# Patient Record
Sex: Female | Born: 1980 | Race: Asian | Hispanic: No | Marital: Married | Smoking: Never smoker
Health system: Southern US, Community
[De-identification: ages and names within clinical notes are randomized; demographics above are authoritative.]

## PROBLEM LIST (undated history)

## (undated) ENCOUNTER — Inpatient Hospital Stay (HOSPITAL_COMMUNITY): Payer: Self-pay

## (undated) DIAGNOSIS — E039 Hypothyroidism, unspecified: Secondary | ICD-10-CM

## (undated) HISTORY — PX: NO PAST SURGERIES: SHX2092

---

## 2013-04-21 LAB — OB RESULTS CONSOLE ABO/RH: RH TYPE: POSITIVE

## 2013-04-21 LAB — OB RESULTS CONSOLE RPR: RPR: NONREACTIVE

## 2013-04-21 LAB — OB RESULTS CONSOLE HEPATITIS B SURFACE ANTIGEN: Hepatitis B Surface Ag: NEGATIVE

## 2013-04-21 LAB — OB RESULTS CONSOLE ANTIBODY SCREEN: Antibody Screen: NEGATIVE

## 2013-04-21 LAB — OB RESULTS CONSOLE GC/CHLAMYDIA
CHLAMYDIA, DNA PROBE: NEGATIVE
Gonorrhea: NEGATIVE

## 2013-04-21 LAB — OB RESULTS CONSOLE RUBELLA ANTIBODY, IGM: Rubella: IMMUNE

## 2013-04-21 LAB — OB RESULTS CONSOLE HIV ANTIBODY (ROUTINE TESTING): HIV: NONREACTIVE

## 2013-07-26 ENCOUNTER — Inpatient Hospital Stay (HOSPITAL_COMMUNITY): Admission: AD | Admit: 2013-07-26 | Payer: Self-pay | Source: Ambulatory Visit | Admitting: Obstetrics and Gynecology

## 2013-08-12 ENCOUNTER — Other Ambulatory Visit: Payer: Self-pay

## 2013-08-13 ENCOUNTER — Other Ambulatory Visit (HOSPITAL_COMMUNITY): Payer: Self-pay | Admitting: Obstetrics and Gynecology

## 2013-08-13 DIAGNOSIS — O4100X0 Oligohydramnios, unspecified trimester, not applicable or unspecified: Secondary | ICD-10-CM

## 2013-08-20 ENCOUNTER — Ambulatory Visit (HOSPITAL_COMMUNITY)
Admission: RE | Admit: 2013-08-20 | Discharge: 2013-08-20 | Disposition: A | Discharge status: Home / Self Care | Payer: BC Managed Care – PPO | Source: Ambulatory Visit | Attending: Obstetrics and Gynecology | Admitting: Obstetrics and Gynecology

## 2013-08-20 ENCOUNTER — Encounter (HOSPITAL_COMMUNITY): Payer: Self-pay

## 2013-08-20 VITALS — BP 111/63 | HR 90 | Wt 166.0 lb

## 2013-08-20 DIAGNOSIS — O4100X0 Oligohydramnios, unspecified trimester, not applicable or unspecified: Secondary | ICD-10-CM | POA: Insufficient documentation

## 2013-08-20 DIAGNOSIS — O43899 Other placental disorders, unspecified trimester: Secondary | ICD-10-CM | POA: Insufficient documentation

## 2013-08-20 DIAGNOSIS — Z363 Encounter for antenatal screening for malformations: Secondary | ICD-10-CM | POA: Insufficient documentation

## 2013-08-20 DIAGNOSIS — O99280 Endocrine, nutritional and metabolic diseases complicating pregnancy, unspecified trimester: Secondary | ICD-10-CM

## 2013-08-20 DIAGNOSIS — E079 Disorder of thyroid, unspecified: Secondary | ICD-10-CM | POA: Insufficient documentation

## 2013-08-20 DIAGNOSIS — E039 Hypothyroidism, unspecified: Secondary | ICD-10-CM | POA: Insufficient documentation

## 2013-08-20 DIAGNOSIS — O358XX0 Maternal care for other (suspected) fetal abnormality and damage, not applicable or unspecified: Secondary | ICD-10-CM | POA: Insufficient documentation

## 2013-08-20 DIAGNOSIS — Z1389 Encounter for screening for other disorder: Secondary | ICD-10-CM | POA: Insufficient documentation

## 2013-08-20 HISTORY — DX: Hypothyroidism, unspecified: E03.9

## 2013-09-02 ENCOUNTER — Institutional Professional Consult (permissible substitution): Payer: Self-pay | Admitting: Pediatrics

## 2013-09-05 ENCOUNTER — Inpatient Hospital Stay (HOSPITAL_COMMUNITY)
Admission: AD | Admit: 2013-09-05 | Discharge: 2013-09-05 | Disposition: A | Discharge status: Home / Self Care | Payer: BC Managed Care – PPO | Source: Ambulatory Visit | Attending: Obstetrics and Gynecology | Admitting: Obstetrics and Gynecology

## 2013-09-05 ENCOUNTER — Encounter (HOSPITAL_COMMUNITY): Payer: Self-pay | Admitting: *Deleted

## 2013-09-05 DIAGNOSIS — IMO0002 Reserved for concepts with insufficient information to code with codable children: Secondary | ICD-10-CM | POA: Insufficient documentation

## 2013-09-05 DIAGNOSIS — Z3490 Encounter for supervision of normal pregnancy, unspecified, unspecified trimester: Secondary | ICD-10-CM

## 2013-09-05 DIAGNOSIS — R609 Edema, unspecified: Secondary | ICD-10-CM

## 2013-09-05 LAB — URINALYSIS, ROUTINE W REFLEX MICROSCOPIC
BILIRUBIN URINE: NEGATIVE
GLUCOSE, UA: 100 mg/dL — AB
HGB URINE DIPSTICK: NEGATIVE
KETONES UR: NEGATIVE mg/dL
Leukocytes, UA: NEGATIVE
NITRITE: NEGATIVE
PH: 5.5 (ref 5.0–8.0)
Protein, ur: NEGATIVE mg/dL
Urobilinogen, UA: 0.2 mg/dL (ref 0.0–1.0)

## 2013-09-05 NOTE — Discharge Instructions (Signed)

## 2013-09-05 NOTE — MAU Provider Note (Signed)
History     CSN: [id number]  Arrival date and time: 09/05/13 7829   First Provider Initiated Contact with Patient 09/05/13 2049      Chief Complaint  Patient presents with  . Foot Swelling   HPI Comments: Amy Wood 32 y.o. G1P0000 presents to MAU with swelling in feet after a trip to [place] today. Her BP has not been elevated and now is 105/64. She denies any headache, blurred vision, abdominal pains.          Past Medical History  Diagnosis Date  . Hypothyroidism     Past Surgical History  Procedure Laterality Date  . No past surgeries      History reviewed. No pertinent family history.  History  Substance Use Topics  . Smoking status: Never Smoker   . Smokeless tobacco: Never Used  . Alcohol Use: No    Allergies: No Known Allergies  Prescriptions prior to admission  Medication Sig Dispense Refill  . Cholecalciferol (VITAMIN D) 2000 UNITS CAPS Take 1 capsule by mouth daily.      Marland Kitchen levothyroxine (SYNTHROID, LEVOTHROID) 150 MCG tablet Take 150 mcg by mouth daily before breakfast.      . Prenatal Vit-Fe Fumarate-FA (PRENATAL MULTIVITAMIN) TABS tablet Take 1 tablet by mouth daily at 12 noon.        Review of Systems  Constitutional: Negative.   Eyes: Negative.   Cardiovascular: Positive for leg swelling.  Gastrointestinal: Negative.   Genitourinary: Negative.   Musculoskeletal: Negative.   Skin: Negative.   Neurological: Negative.   Psychiatric/Behavioral: Negative.    Physical Exam   Blood pressure 105/64, pulse 93, temperature 97.9 F (36.6 C), temperature source Oral, resp. rate 20, height 5\' 3"  (1.6 m), weight 171 lb (77.565 kg), last menstrual period 02/21/2013.  Physical Exam  Constitutional: She is oriented to person, place, and time. She appears well-developed and well-nourished. No distress.  HENT:  Head: Normocephalic and atraumatic.  Eyes: Pupils are equal, round, and reactive to light.  Cardiovascular: Normal rate,  regular rhythm and normal heart sounds.   Respiratory: Effort normal and breath sounds normal.  GI: Soft. Bowel sounds are normal.  Genitourinary:  Not examined  Musculoskeletal: She exhibits edema.  +1 edema bilateral  Neurological: She is alert and oriented to person, place, and time.  Skin: Skin is warm and dry.  Psychiatric: She has a normal mood and affect. Her behavior is normal. Judgment and thought content normal.   Results for orders placed during the hospital encounter of 09/05/13 (from the past 24 hour(s))  URINALYSIS, ROUTINE W REFLEX MICROSCOPIC     Status: Abnormal   Collection Time    09/05/13  7:40 PM      Result Value Ref Range   Color, Urine YELLOW  YELLOW   APPearance CLEAR  CLEAR   Specific Gravity, Urine <1.005 (*) 1.005 - 1.030   pH 5.5  5.0 - 8.0   Glucose, UA 100 (*) NEGATIVE mg/dL   Hgb urine dipstick NEGATIVE  NEGATIVE   Bilirubin Urine NEGATIVE  NEGATIVE   Ketones, ur NEGATIVE  NEGATIVE mg/dL   Protein, ur NEGATIVE  NEGATIVE mg/dL   Urobilinogen, UA 0.2  0.0 - 1.0 mg/dL   Nitrite NEGATIVE  NEGATIVE   Leukocytes, UA NEGATIVE  NEGATIVE     MAU Course  Procedures  MDM  Spoke with Dr Marcelle Overlie who agreed it was ok to send her home  Assessment and Plan   A: Peripheral Edema  P: Advised on all  ways to help with edema in pregnancy Follow up with Dr Adkins/ Marcelle OverlieHolland  Amy ServeBarefoot, Amy Wood 09/05/2013, 8:49 PM

## 2013-09-05 NOTE — MAU Note (Signed)
PT SAYS  SHE  TRAVELLED TO [place] TODAY- AND FEET NOW FEEL PUFFY.  HAS BEEN LITTLE  SWOLLEN  BEFORE-  LEFT IS MORE SWOLLEN THAN  RIGHT...    DENIES UC.

## 2013-09-08 ENCOUNTER — Ambulatory Visit (INDEPENDENT_AMBULATORY_CARE_PROVIDER_SITE_OTHER): Payer: Self-pay | Admitting: Pediatrics

## 2013-09-08 DIAGNOSIS — Z7681 Expectant parent(s) prebirth pediatrician visit: Secondary | ICD-10-CM

## 2013-09-08 NOTE — Progress Notes (Signed)
Prenatal counseling for impending newborn done  

## 2013-09-16 ENCOUNTER — Ambulatory Visit (HOSPITAL_COMMUNITY)
Admission: RE | Admit: 2013-09-16 | Discharge: 2013-09-16 | Disposition: A | Discharge status: Home / Self Care | Payer: BC Managed Care – PPO | Source: Ambulatory Visit | Attending: Obstetrics and Gynecology | Admitting: Obstetrics and Gynecology

## 2013-09-16 ENCOUNTER — Other Ambulatory Visit (HOSPITAL_COMMUNITY): Payer: Self-pay | Admitting: Maternal and Fetal Medicine

## 2013-09-16 VITALS — BP 118/64 | HR 88 | Wt 173.0 lb

## 2013-09-16 DIAGNOSIS — O4100X0 Oligohydramnios, unspecified trimester, not applicable or unspecified: Secondary | ICD-10-CM

## 2013-09-16 DIAGNOSIS — E039 Hypothyroidism, unspecified: Secondary | ICD-10-CM | POA: Insufficient documentation

## 2013-09-16 DIAGNOSIS — O43899 Other placental disorders, unspecified trimester: Secondary | ICD-10-CM | POA: Insufficient documentation

## 2013-09-16 DIAGNOSIS — E079 Disorder of thyroid, unspecified: Secondary | ICD-10-CM

## 2013-09-16 DIAGNOSIS — O99280 Endocrine, nutritional and metabolic diseases complicating pregnancy, unspecified trimester: Secondary | ICD-10-CM

## 2013-09-30 ENCOUNTER — Other Ambulatory Visit (HOSPITAL_COMMUNITY): Payer: Self-pay | Admitting: Maternal and Fetal Medicine

## 2013-09-30 ENCOUNTER — Observation Stay (HOSPITAL_COMMUNITY)
Admission: AD | Admit: 2013-09-30 | Discharge: 2013-10-01 | Disposition: A | Discharge status: Home / Self Care | Payer: BC Managed Care – PPO | Source: Ambulatory Visit | Attending: Obstetrics and Gynecology | Admitting: Obstetrics and Gynecology

## 2013-09-30 ENCOUNTER — Ambulatory Visit (HOSPITAL_COMMUNITY)
Admission: RE | Admit: 2013-09-30 | Discharge: 2013-09-30 | Disposition: A | Discharge status: Home / Self Care | Payer: BC Managed Care – PPO | Source: Ambulatory Visit | Attending: Obstetrics and Gynecology | Admitting: Obstetrics and Gynecology

## 2013-09-30 ENCOUNTER — Encounter (HOSPITAL_COMMUNITY): Payer: Self-pay | Admitting: *Deleted

## 2013-09-30 DIAGNOSIS — O4100X0 Oligohydramnios, unspecified trimester, not applicable or unspecified: Principal | ICD-10-CM | POA: Insufficient documentation

## 2013-09-30 DIAGNOSIS — E079 Disorder of thyroid, unspecified: Secondary | ICD-10-CM

## 2013-09-30 DIAGNOSIS — O43899 Other placental disorders, unspecified trimester: Secondary | ICD-10-CM

## 2013-09-30 DIAGNOSIS — E039 Hypothyroidism, unspecified: Secondary | ICD-10-CM | POA: Insufficient documentation

## 2013-09-30 DIAGNOSIS — O4103X0 Oligohydramnios, third trimester, not applicable or unspecified: Secondary | ICD-10-CM | POA: Diagnosis present

## 2013-09-30 DIAGNOSIS — O99280 Endocrine, nutritional and metabolic diseases complicating pregnancy, unspecified trimester: Secondary | ICD-10-CM

## 2013-09-30 LAB — COMPREHENSIVE METABOLIC PANEL
ALT: 13 U/L (ref 0–35)
AST: 15 U/L (ref 0–37)
Albumin: 2.5 g/dL — ABNORMAL LOW (ref 3.5–5.2)
Alkaline Phosphatase: 120 U/L — ABNORMAL HIGH (ref 39–117)
BILIRUBIN TOTAL: 0.4 mg/dL (ref 0.3–1.2)
BUN: 6 mg/dL (ref 6–23)
CHLORIDE: 101 meq/L (ref 96–112)
CO2: 25 meq/L (ref 19–32)
Calcium: 9.2 mg/dL (ref 8.4–10.5)
Creatinine, Ser: 0.47 mg/dL — ABNORMAL LOW (ref 0.50–1.10)
Glucose, Bld: 156 mg/dL — ABNORMAL HIGH (ref 70–99)
Potassium: 4.2 mEq/L (ref 3.7–5.3)
SODIUM: 139 meq/L (ref 137–147)
Total Protein: 5.7 g/dL — ABNORMAL LOW (ref 6.0–8.3)

## 2013-09-30 LAB — TSH: TSH: 1.35 u[IU]/mL (ref 0.350–4.500)

## 2013-09-30 MED ORDER — PRENATAL MULTIVITAMIN CH
1.0000 | ORAL_TABLET | Freq: Every day | ORAL | Status: DC
  Filled 2013-09-30: qty 1

## 2013-09-30 MED ORDER — ZOLPIDEM TARTRATE 5 MG PO TABS: 5.0000 mg | ORAL_TABLET | Freq: Every evening | ORAL | Status: DC | PRN

## 2013-09-30 MED ORDER — DOCUSATE SODIUM 100 MG PO CAPS
100.0000 mg | ORAL_CAPSULE | Freq: Every day | ORAL | Status: DC
  Filled 2013-09-30: qty 1

## 2013-09-30 MED ORDER — BETAMETHASONE SOD PHOS & ACET 6 (3-3) MG/ML IJ SUSP
12.0000 mg | INTRAMUSCULAR | Status: AC
  Administered 2013-09-30 – 2013-10-01 (×2): 12 mg via INTRAMUSCULAR
  Filled 2013-09-30 (×3): qty 2

## 2013-09-30 MED ORDER — LEVOTHYROXINE SODIUM 150 MCG PO TABS
150.0000 ug | ORAL_TABLET | Freq: Every day | ORAL | Status: DC
  Filled 2013-09-30 (×2): qty 1

## 2013-09-30 MED ORDER — LACTATED RINGERS IV SOLN
INTRAVENOUS | Status: DC
  Administered 2013-10-01 (×2): via INTRAVENOUS

## 2013-09-30 MED ORDER — CALCIUM CARBONATE ANTACID 500 MG PO CHEW: 2.0000 | CHEWABLE_TABLET | ORAL | Status: DC | PRN

## 2013-09-30 MED ORDER — ACETAMINOPHEN 325 MG PO TABS: 650.0000 mg | ORAL_TABLET | ORAL | Status: DC | PRN

## 2013-09-30 NOTE — Progress Notes (Signed)
[facility] ultrasound  Indication: 33 yr old G1P0 at 6321w4d with previous finding of decreased amniotic fluid volume and hypothyroidism for AFI.  Findings: 1. Single intrauterine pregnancy.  2. Anterior placenta without evidence of previa. 3. Oligohydramnios with AFI of 5.62cm; there is no 2x2cm pocket of amniotic fluid seen. 4. The limited anatomy survey is normal. 5. Normal umbilical artery Doppler studies. 6. Biophysical profile is 6/8 (-2 for fluid).  Recommendations: 1. Oligohyramnios: - Discussed etiology is unclear. Patient reports she has not had any leaking of fluid or vaginal bleeding. Recommend patient be evaluated on labor and delivery to rule out rupture of membranes. Another possible etiology is placental insufficiency. However, it is reassuring that there was recent appropriate fetal growth and normal umbilical artery Doppler studies today.  I discussed with oligohydramnios there is an increased risk of fetal demise and nonreassuring fetal testing. Given this I recommend the patient be admitted to the hospital for extensive fetal monitoring. Recommend administration of a course of betamethasone. Recommend Neonatology consultation. Recommend on 1 liter of IV fluids and p.o. Hydration. Will repeat amniotic fluid index in 1 day. If amniotic fluid index is greater than 5cm and there is a 2x2cm pocket can consider outpatient management with close follow up for AFI and antenatal testing. Recommend delivery for nonreassuring fetal testing or no fetal growth. Fetal growth in 2 weeks 2. BPP 6/8: - needs NST; to be done on L&D  Discussed with Dr. Henderson Cloudomblin; patient sent to Labor and Delivery.  Eulis FosterKristen Machelle Raybon, MD

## 2013-09-30 NOTE — H&P (Signed)
Amy Wood is a 33 y.o. female presenting 31.4 weeks with oligohydramnios. She's been followed in the office with decreased amniotic fluid. He does have a battledore placenta. She was seen by maternal fetal medicine today. On the gram did not reveal a 2 x 2 centimeter pocket of amniotic fluid. Amniotic fluid index was less than the 3rd percentile. Doppler studies were normal. I a physical profile was 6/8. It was suggested that we admit her for hydration. We should repeat the ultrasound for amniotic fluid tomorrow. Maternal Medical History:  Prenatal complications: Oligohydramnios.   Prenatal Complications - Diabetes: none.    OB History   Grav Para Term Preterm Abortions TAB SAB Ect Mult Living   1 0 0 0 0 0 0 0 0 0      Past Medical History  Diagnosis Date  . Hypothyroidism    Past Surgical History  Procedure Laterality Date  . No past surgeries     Family History: family history is not on file. Social History:  reports that she has never smoked. She has never used smokeless tobacco. She reports that she does not drink alcohol or use illicit drugs.   Prenatal Transfer Tool  Maternal Diabetes: No Genetic Screening: Declined Maternal Ultrasounds/Referrals: Abnormal:  Findings:   Other:oligohydramnios Fetal Ultrasounds or other Referrals:  None Maternal Substance Abuse:  No Significant Maternal Medications:  None Significant Maternal Lab Results:  None Other Comments:  None  ROS    Height 5\' 4"  (1.626 m), weight 77.111 kg (170 lb), last menstrual period 02/21/2013. Exam Physical Exam  Constitutional: She is oriented to person, place, and time. She appears well-developed and well-nourished.  HENT:  Head: Normocephalic.  Cardiovascular: Normal rate, regular rhythm and normal heart sounds.   Respiratory: Effort normal and breath sounds normal.  GI:  Gravid uterus otherwise benign abdomine  Musculoskeletal: She exhibits edema.  Neurological: She is alert and  oriented to person, place, and time. She has normal reflexes.    Prenatal labs: ABO, Rh:   Antibody:   Rubella:   RPR:    HBsAg:    HIV:    GBS:     Assessment/Plan: Intrauterine pregnancy at 31.4 weeks with oligohydramnios. A she'll be admitted for hydration. Repeat ultrasound tomorrow to look for amniotic fluid.   Raphael GibneyJohn S Judy Goodenow 09/30/2013, 3:04 PM

## 2013-09-30 NOTE — Plan of Care (Signed)
Problem: Consults Goal: Birthing Suites Patient Information Press F2 to bring up selections list  Outcome: Completed/Met Date Met:  09/30/13  Pt < [redacted] weeks EGA

## 2013-10-01 ENCOUNTER — Encounter (HOSPITAL_COMMUNITY): Payer: BC Managed Care – PPO

## 2013-10-01 ENCOUNTER — Observation Stay (HOSPITAL_COMMUNITY): Payer: BC Managed Care – PPO

## 2013-10-01 LAB — CBC
HEMATOCRIT: 37.6 % (ref 36.0–46.0)
Hemoglobin: 12.9 g/dL (ref 12.0–15.0)
MCH: 31.8 pg (ref 26.0–34.0)
MCHC: 34.3 g/dL (ref 30.0–36.0)
MCV: 92.6 fL (ref 78.0–100.0)
PLATELETS: 213 10*3/uL (ref 150–400)
RBC: 4.06 MIL/uL (ref 3.87–5.11)
RDW: 12.9 % (ref 11.5–15.5)
WBC: 14.5 10*3/uL — AB (ref 4.0–10.5)

## 2013-10-01 LAB — AMNISURE RUPTURE OF MEMBRANE (ROM) NOT AT ARMC: Amnisure ROM: NEGATIVE

## 2013-10-01 NOTE — Progress Notes (Signed)
Amnisure negative.  Discharge home per previous note.  Mitchel HonourMegan Laurieanne Galloway, DO

## 2013-10-01 NOTE — Discharge Instructions (Signed)
Call MD for T>100.4, severe abdominal pain, heavy vaginal bleeding, or decreased fetal movement.  Do kick counts.  Call for appointment for follow up in office Monday for ultrasound and MD visit.

## 2013-10-01 NOTE — Discharge Summary (Signed)
Obstetric Discharge Summary Reason for Admission: oligohydramnios Prenatal Procedures: ultrasound Intrapartum Procedures: n/a Postpartum Procedures: n/a Complications-Operative and Postpartum: n/a Hemoglobin  Date Value Ref Range Status  10/01/2013 12.9  12.0 - 15.0 g/dL Final     HCT  Date Value Ref Range Status  10/01/2013 37.6  36.0 - 46.0 % Final    Physical Exam:  General: alert, cooperative and appears stated age 61Lochia: n/a Uterine Fundus: soft Incision: n/a DVT Evaluation: No evidence of DVT seen on physical exam. Negative Homan's sign. No cords or calf tenderness.  Discharge Diagnoses: borderline low amniotic fluid  Discharge Information: Date: 10/01/2013 Activity: unrestricted Diet: routine Medications: PNV Condition: stable Instructions: refer to practice specific booklet Discharge to: home   Newborn Data: <<This patient has not yet delivered during this pregnancy.>> Home with mother.  Amy Wood 10/01/2013, 1:17 PM

## 2013-10-01 NOTE — Progress Notes (Addendum)
No complaints.  No LOF.  No abdominal tenderness.  +FM.  VSS.  AF  Gen: A&O x 3 Abd: soft, NT Ext: no c/c/e  FHT Cat I Toco: CTX q 3-4 in runs.  (Pt not feeling).  MFM U/S: AFI 5 with 2x2 cm pocket seen.  BPP 8/8.  32yo G1 at 4646w5d -Will do Amnisure to r/o PPROM -If negative, will d/c home with close f/u in the office per MFM recommendations.  Mitchel HonourMegan Temprance Wyre, DO

## 2013-11-04 LAB — OB RESULTS CONSOLE GBS: STREP GROUP B AG: POSITIVE

## 2013-11-21 ENCOUNTER — Encounter (HOSPITAL_COMMUNITY): Payer: Self-pay | Admitting: *Deleted

## 2013-11-21 ENCOUNTER — Inpatient Hospital Stay (HOSPITAL_COMMUNITY)
Admission: AD | Admit: 2013-11-21 | Discharge: 2013-11-21 | Disposition: A | Discharge status: Home / Self Care | Payer: BC Managed Care – PPO | Source: Ambulatory Visit | Attending: Obstetrics and Gynecology | Admitting: Obstetrics and Gynecology

## 2013-11-21 DIAGNOSIS — O479 False labor, unspecified: Secondary | ICD-10-CM | POA: Insufficient documentation

## 2013-11-21 DIAGNOSIS — O4100X0 Oligohydramnios, unspecified trimester, not applicable or unspecified: Secondary | ICD-10-CM | POA: Insufficient documentation

## 2013-11-21 LAB — POCT FERN TEST: POCT Fern Test: NEGATIVE

## 2013-11-21 NOTE — Discharge Instructions (Signed)
Fetal Movement Counts Patient Name: __________________________________________________ Patient Due Date: ____________________ Performing a fetal movement count is highly recommended in high-risk pregnancies, but it is good for every pregnant woman to do. Your caregiver may ask you to start counting fetal movements at 28 weeks of the pregnancy. Fetal movements often increase:  After eating a full meal.  After physical activity.  After eating or drinking something sweet or cold.  At rest. Pay attention to when you feel the baby is most active. This will help you notice a pattern of your baby's sleep and wake cycles and what factors contribute to an increase in fetal movement. It is important to perform a fetal movement count at the same time each day when your baby is normally most active.  HOW TO COUNT FETAL MOVEMENTS 1. Find a quiet and comfortable area to sit or lie down on your left side. Lying on your left side provides the best blood and oxygen circulation to your baby. Write down the day and time on a sheet of paper or in a journal. Third Trimester of Pregnancy The third trimester is from week 29 through week 42, months 7 through 9. The third trimester is a time when the fetus is growing rapidly. At the end of the ninth month, the fetus is about 20 inches in length and weighs 6 10 pounds.  BODY CHANGES Your body goes through many changes during pregnancy. The changes vary from woman to woman.  Your weight will continue to increase. You can expect to gain 25 35 pounds (11 16 kg) by the end of the pregnancy. You may begin to get stretch marks on your hips, abdomen, and breasts. You may urinate more often because the fetus is moving lower into your pelvis and pressing on your bladder. You may develop or continue to have heartburn as a result of your pregnancy. You may develop constipation because certain hormones are causing the muscles that push waste through your intestines to slow  down. You may develop hemorrhoids or swollen, bulging veins (varicose veins). You may have pelvic pain because of the weight gain and pregnancy hormones relaxing your joints between the bones in your pelvis. Back aches may result from over exertion of the muscles supporting your posture. Your breasts will continue to grow and be tender. A yellow discharge may leak from your breasts called colostrum. Your belly button may stick out. You may feel short of breath because of your expanding uterus. You may notice the fetus "dropping," or moving lower in your abdomen. You may have a bloody mucus discharge. This usually occurs a few days to a week before labor begins. Your cervix becomes thin and soft (effaced) near your due date. WHAT TO EXPECT AT YOUR PRENATAL EXAMS  You will have prenatal exams every 2 weeks until week 36. Then, you will have weekly prenatal exams. During a routine prenatal visit: You will be weighed to make sure you and the fetus are growing normally. Your blood pressure is taken. Your abdomen will be measured to track your baby's growth. The fetal heartbeat will be listened to. Any test results from the previous visit will be discussed. You may have a cervical check near your due date to see if you have effaced. At around 36 weeks, your caregiver will check your cervix. At the same time, your caregiver will also perform a test on the secretions of the vaginal tissue. This test is to determine if a type of bacteria, Group B streptococcus, is present.  Your caregiver will explain this further. Your caregiver may ask you: What your birth plan is. How you are feeling. If you are feeling the baby move. If you have had any abnormal symptoms, such as leaking fluid, bleeding, severe headaches, or abdominal cramping. If you have any questions. Other tests or screenings that may be performed during your third trimester include: Blood tests that check for low iron levels (anemia). Fetal  testing to check the health, activity level, and growth of the fetus. Testing is done if you have certain medical conditions or if there are problems during the pregnancy. FALSE LABOR You may feel small, irregular contractions that eventually go away. These are called Braxton Hicks contractions, or false labor. Contractions may last for hours, days, or even weeks before true labor sets in. If contractions come at regular intervals, intensify, or become painful, it is best to be seen by your caregiver.  SIGNS OF LABOR  Menstrual-like cramps. Contractions that are 5 minutes apart or less. Contractions that start on the top of the uterus and spread down to the lower abdomen and back. A sense of increased pelvic pressure or back pain. A watery or bloody mucus discharge that comes from the vagina. If you have any of these signs before the 37th week of pregnancy, call your caregiver right away. You need to go to the hospital to get checked immediately. HOME CARE INSTRUCTIONS  Avoid all smoking, herbs, alcohol, and unprescribed drugs. These chemicals affect the formation and growth of the baby. Follow your caregiver's instructions regarding medicine use. There are medicines that are either safe or unsafe to take during pregnancy. Exercise only as directed by your caregiver. Experiencing uterine cramps is a good sign to stop exercising. Continue to eat regular, healthy meals. Wear a good support bra for breast tenderness. Do not use hot tubs, steam rooms, or saunas. Wear your seat belt at all times when driving. Avoid raw meat, uncooked cheese, cat litter boxes, and soil used by cats. These carry germs that can cause birth defects in the baby. Take your prenatal vitamins. Try taking a stool softener (if your caregiver approves) if you develop constipation. Eat more high-fiber foods, such as fresh vegetables or fruit and whole grains. Drink plenty of fluids to keep your urine clear or pale yellow. Take  warm sitz baths to soothe any pain or discomfort caused by hemorrhoids. Use hemorrhoid cream if your caregiver approves. If you develop varicose veins, wear support hose. Elevate your feet for 15 minutes, 3 4 times a day. Limit salt in your diet. Avoid heavy lifting, wear low heal shoes, and practice good posture. Rest a lot with your legs elevated if you have leg cramps or low back pain. Visit your dentist if you have not gone during your pregnancy. Use a soft toothbrush to brush your teeth and be gentle when you floss. A sexual relationship may be continued unless your caregiver directs you otherwise. Do not travel far distances unless it is absolutely necessary and only with the approval of your caregiver. Take prenatal classes to understand, practice, and ask questions about the labor and delivery. Make a trial run to the hospital. Pack your hospital bag. Prepare the baby's nursery. Continue to go to all your prenatal visits as directed by your caregiver. SEEK MEDICAL CARE IF: You are unsure if you are in labor or if your water has broken. You have dizziness. You have mild pelvic cramps, pelvic pressure, or nagging pain in your abdominal area. You  have persistent nausea, vomiting, or diarrhea. You have a bad smelling vaginal discharge. You have pain with urination. SEEK IMMEDIATE MEDICAL CARE IF:  You have a fever. You are leaking fluid from your vagina. You have spotting or bleeding from your vagina. You have severe abdominal cramping or pain. You have rapid weight loss or gain. You have shortness of breath with chest pain. You notice sudden or extreme swelling of your face, hands, ankles, feet, or legs. You have not felt your baby move in over an hour. You have severe headaches that do not go away with medicine. You have vision changes. Document Released: 05/22/2001 Document Revised: 01/28/2013 Document Reviewed: 07/29/2012 ExitCare Patient Information 2014 [place],  [place].  2. Start counting kicks, flutters, swishes, rolls, or jabs in a 2 hour period. You should feel at least 10 movements within 2 hours. 3. If you do not feel 10 movements in 2 hours, wait 2 3 hours and count again. Look for a change in the pattern or not enough counts in 2 hours. SEEK MEDICAL CARE IF:  You feel less than 10 counts in 2 hours, tried twice.  There is no movement in over an hour.  The pattern is changing or taking longer each day to reach 10 counts in 2 hours.  You feel the baby is not moving as he or she usually does. Date: ____________ Movements: ____________ Start time: ____________ Doreatha Martin time: ____________  Date: ____________ Movements: ____________ Start time: ____________ Doreatha Martin time: ____________ Date: ____________ Movements: ____________ Start time: ____________ Doreatha Martin time: ____________ Date: ____________ Movements: ____________ Start time: ____________ Doreatha Martin time: ____________ Date: ____________ Movements: ____________ Start time: ____________ Doreatha Martin time: ____________ Date: ____________ Movements: ____________ Start time: ____________ Doreatha Martin time: ____________ Date: ____________ Movements: ____________ Start time: ____________ Doreatha Martin time: ____________ Date: ____________ Movements: ____________ Start time: ____________ Doreatha Martin time: ____________  Date: ____________ Movements: ____________ Start time: ____________ Doreatha Martin time: ____________ Date: ____________ Movements: ____________ Start time: ____________ Doreatha Martin time: ____________ Date: ____________ Movements: ____________ Start time: ____________ Doreatha Martin time: ____________ Date: ____________ Movements: ____________ Start time: ____________ Doreatha Martin time: ____________ Date: ____________ Movements: ____________ Start time: ____________ Doreatha Martin time: ____________ Date: ____________ Movements: ____________ Start time: ____________ Doreatha Martin time: ____________ Date: ____________ Movements: ____________ Start time:  ____________ Doreatha Martin time: ____________  Date: ____________ Movements: ____________ Start time: ____________ Doreatha Martin time: ____________ Date: ____________ Movements: ____________ Start time: ____________ Doreatha Martin time: ____________ Date: ____________ Movements: ____________ Start time: ____________ Doreatha Martin time: ____________ Date: ____________ Movements: ____________ Start time: ____________ Doreatha Martin time: ____________ Date: ____________ Movements: ____________ Start time: ____________ Doreatha Martin time: ____________ Date: ____________ Movements: ____________ Start time: ____________ Doreatha Martin time: ____________ Date: ____________ Movements: ____________ Start time: ____________ Doreatha Martin time: ____________  Date: ____________ Movements: ____________ Start time: ____________ Doreatha Martin time: ____________ Date: ____________ Movements: ____________ Start time: ____________ Doreatha Martin time: ____________ Date: ____________ Movements: ____________ Start time: ____________ Doreatha Martin time: ____________ Date: ____________ Movements: ____________ Start time: ____________ Doreatha Martin time: ____________ Date: ____________ Movements: ____________ Start time: ____________ Doreatha Martin time: ____________ Date: ____________ Movements: ____________ Start time: ____________ Doreatha Martin time: ____________ Date: ____________ Movements: ____________ Start time: ____________ Doreatha Martin time: ____________  Date: ____________ Movements: ____________ Start time: ____________ Doreatha Martin time: ____________ Date: ____________ Movements: ____________ Start time: ____________ Doreatha Martin time: ____________ Date: ____________ Movements: ____________ Start time: ____________ Doreatha Martin time: ____________ Date: ____________ Movements: ____________ Start time: ____________ Doreatha Martin time: ____________ Date: ____________ Movements: ____________ Start time: ____________ Doreatha Martin time: ____________ Date: ____________ Movements: ____________ Start time: ____________ Doreatha Martin time: ____________ Date:  ____________ Movements: ____________ Start time: ____________ Doreatha Martin time:  ____________  Date: ____________ Movements: ____________ Start time: ____________ Doreatha MartinFinish time: ____________ Date: ____________ Movements: ____________ Start time: ____________ Doreatha MartinFinish time: ____________ Date: ____________ Movements: ____________ Start time: ____________ Doreatha MartinFinish time: ____________ Date: ____________ Movements: ____________ Start time: ____________ Doreatha MartinFinish time: ____________ Date: ____________ Movements: ____________ Start time: ____________ Doreatha MartinFinish time: ____________ Date: ____________ Movements: ____________ Start time: ____________ Doreatha MartinFinish time: ____________ Date: ____________ Movements: ____________ Start time: ____________ Doreatha MartinFinish time: ____________  Date: ____________ Movements: ____________ Start time: ____________ Doreatha MartinFinish time: ____________ Date: ____________ Movements: ____________ Start time: ____________ Doreatha MartinFinish time: ____________ Date: ____________ Movements: ____________ Start time: ____________ Doreatha MartinFinish time: ____________ Date: ____________ Movements: ____________ Start time: ____________ Doreatha MartinFinish time: ____________ Date: ____________ Movements: ____________ Start time: ____________ Doreatha MartinFinish time: ____________ Date: ____________ Movements: ____________ Start time: ____________ Doreatha MartinFinish time: ____________ Date: ____________ Movements: ____________ Start time: ____________ Doreatha MartinFinish time: ____________  Date: ____________ Movements: ____________ Start time: ____________ Doreatha MartinFinish time: ____________ Date: ____________ Movements: ____________ Start time: ____________ Doreatha MartinFinish time: ____________ Date: ____________ Movements: ____________ Start time: ____________ Doreatha MartinFinish time: ____________ Date: ____________ Movements: ____________ Start time: ____________ Doreatha MartinFinish time: ____________ Date: ____________ Movements: ____________ Start time: ____________ Doreatha MartinFinish time: ____________ Date: ____________ Movements: ____________ Start  time: ____________ Doreatha MartinFinish time: ____________ Document Released: 06/27/2006 Document Revised: 05/14/2012 Document Reviewed: 03/24/2012 Exit[employer] Patient Information 2014 [employer].

## 2013-11-21 NOTE — MAU Note (Signed)
Gush of fluid, some trickling on legs @ 1200, denies bleeding or uc's.

## 2013-11-21 NOTE — MAU Provider Note (Signed)
S: Amy Wood is a 33 y.o. G1P0000 at 4751w0d who presents today with LOF. She states that around 1200 today she had some small gushes of fluid. She denies any pain, contractions or bleeding, and states that that the baby is moving normally. She states that she has had oligohydramnios with the pregnancy, and had an US yesterday with an AFI of 10. She denies any other complications with the pregnancy O: VSS, afebrile External: no lesion Vagina: small amount of white discharge. No pooling  Cervix: pink, smooth, closed/thick/high. Difficult exam Uterus: AGA FHT 135, moderate with 15x15 accels, no decels Toco: irregular UCs, patient states that she does not feel them. A/P: possible ROM Fern pending RN will report to MD

## 2013-11-25 ENCOUNTER — Encounter (HOSPITAL_COMMUNITY)
Admission: AD | Disposition: A | Discharge status: Home / Self Care | Payer: Self-pay | Source: Ambulatory Visit | Attending: Obstetrics and Gynecology

## 2013-11-25 ENCOUNTER — Encounter (HOSPITAL_COMMUNITY): Payer: Self-pay | Admitting: *Deleted

## 2013-11-25 ENCOUNTER — Inpatient Hospital Stay (HOSPITAL_COMMUNITY): Payer: BC Managed Care – PPO | Admitting: Anesthesiology

## 2013-11-25 ENCOUNTER — Encounter (HOSPITAL_COMMUNITY): Payer: BC Managed Care – PPO | Admitting: Anesthesiology

## 2013-11-25 ENCOUNTER — Inpatient Hospital Stay (HOSPITAL_COMMUNITY)
Admission: AD | Admit: 2013-11-25 | Discharge: 2013-11-28 | DRG: 765 | Disposition: A | Discharge status: Home / Self Care | Payer: BC Managed Care – PPO | Source: Ambulatory Visit | Attending: Obstetrics and Gynecology | Admitting: Obstetrics and Gynecology

## 2013-11-25 DIAGNOSIS — O43899 Other placental disorders, unspecified trimester: Secondary | ICD-10-CM | POA: Diagnosis present

## 2013-11-25 DIAGNOSIS — O4100X0 Oligohydramnios, unspecified trimester, not applicable or unspecified: Secondary | ICD-10-CM | POA: Diagnosis present

## 2013-11-25 DIAGNOSIS — O99892 Other specified diseases and conditions complicating childbirth: Secondary | ICD-10-CM | POA: Diagnosis present

## 2013-11-25 DIAGNOSIS — O9989 Other specified diseases and conditions complicating pregnancy, childbirth and the puerperium: Secondary | ICD-10-CM

## 2013-11-25 DIAGNOSIS — Z22330 Carrier of Group B streptococcus: Secondary | ICD-10-CM

## 2013-11-25 DIAGNOSIS — IMO0001 Reserved for inherently not codable concepts without codable children: Secondary | ICD-10-CM

## 2013-11-25 DIAGNOSIS — O99284 Endocrine, nutritional and metabolic diseases complicating childbirth: Secondary | ICD-10-CM

## 2013-11-25 DIAGNOSIS — Z98891 History of uterine scar from previous surgery: Secondary | ICD-10-CM

## 2013-11-25 DIAGNOSIS — E079 Disorder of thyroid, unspecified: Secondary | ICD-10-CM | POA: Diagnosis present

## 2013-11-25 DIAGNOSIS — E039 Hypothyroidism, unspecified: Secondary | ICD-10-CM | POA: Diagnosis present

## 2013-11-25 LAB — TYPE AND SCREEN
ABO/RH(D): O POS
Antibody Screen: NEGATIVE

## 2013-11-25 LAB — CBC
HCT: 40.9 % (ref 36.0–46.0)
Hemoglobin: 14.2 g/dL (ref 12.0–15.0)
MCH: 31.7 pg (ref 26.0–34.0)
MCHC: 34.7 g/dL (ref 30.0–36.0)
MCV: 91.3 fL (ref 78.0–100.0)
PLATELETS: 197 10*3/uL (ref 150–400)
RBC: 4.48 MIL/uL (ref 3.87–5.11)
RDW: 12.7 % (ref 11.5–15.5)
WBC: 11 10*3/uL — ABNORMAL HIGH (ref 4.0–10.5)

## 2013-11-25 LAB — RPR

## 2013-11-25 LAB — ABO/RH: ABO/RH(D): O POS

## 2013-11-25 SURGERY — Surgical Case
Anesthesia: Epidural | Site: Abdomen

## 2013-11-25 MED ORDER — ONDANSETRON HCL 4 MG/2ML IJ SOLN: 4.0000 mg | Freq: Four times a day (QID) | INTRAMUSCULAR | Status: DC | PRN

## 2013-11-25 MED ORDER — LANOLIN HYDROUS EX OINT: 1.0000 "application " | TOPICAL_OINTMENT | CUTANEOUS | Status: DC | PRN

## 2013-11-25 MED ORDER — BISACODYL 10 MG RE SUPP: 10.0000 mg | Freq: Every day | RECTAL | Status: DC | PRN

## 2013-11-25 MED ORDER — TERBUTALINE SULFATE 1 MG/ML IJ SOLN: 0.2500 mg | Freq: Once | INTRAMUSCULAR | Status: DC | PRN

## 2013-11-25 MED ORDER — OXYTOCIN 40 UNITS IN LACTATED RINGERS INFUSION - SIMPLE MED
62.5000 mL/h | INTRAVENOUS | Status: DC
Start: 2013-11-25 — End: 2013-11-25

## 2013-11-25 MED ORDER — NALOXONE HCL 1 MG/ML IJ SOLN
1.0000 ug/kg/h | INTRAVENOUS | Status: DC | PRN
  Filled 2013-11-25: qty 2

## 2013-11-25 MED ORDER — LACTATED RINGERS IV SOLN
INTRAVENOUS | Status: DC
  Administered 2013-11-25: 125 mL/h via INTRAVENOUS
  Administered 2013-11-25 (×3): via INTRAVENOUS

## 2013-11-25 MED ORDER — WITCH HAZEL-GLYCERIN EX PADS
1.0000 | MEDICATED_PAD | CUTANEOUS | Status: DC | PRN
Start: 2013-11-25 — End: 2013-11-28

## 2013-11-25 MED ORDER — OXYCODONE-ACETAMINOPHEN 5-325 MG PO TABS
1.0000 | ORAL_TABLET | ORAL | Status: DC | PRN
  Administered 2013-11-27 – 2013-11-28 (×3): 1 via ORAL
  Filled 2013-11-25 (×3): qty 1

## 2013-11-25 MED ORDER — SODIUM CHLORIDE 0.9 % IV SOLN: 250.0000 mL | INTRAVENOUS | Status: DC

## 2013-11-25 MED ORDER — NALBUPHINE HCL 10 MG/ML IJ SOLN: 5.0000 mg | INTRAMUSCULAR | Status: DC | PRN

## 2013-11-25 MED ORDER — PHENYLEPHRINE HCL 10 MG/ML IJ SOLN
INTRAMUSCULAR | Status: DC | PRN
  Administered 2013-11-25: 80 ug via INTRAVENOUS

## 2013-11-25 MED ORDER — DIPHENHYDRAMINE HCL 50 MG/ML IJ SOLN: 25.0000 mg | INTRAMUSCULAR | Status: DC | PRN

## 2013-11-25 MED ORDER — FLEET ENEMA 7-19 GM/118ML RE ENEM: 1.0000 | ENEMA | RECTAL | Status: DC | PRN

## 2013-11-25 MED ORDER — PHENYLEPHRINE 8 MG IN D5W 100 ML (0.08MG/ML) PREMIX OPTIME: INJECTION | INTRAVENOUS | Status: DC | PRN

## 2013-11-25 MED ORDER — MORPHINE SULFATE (PF) 0.5 MG/ML IJ SOLN
INTRAMUSCULAR | Status: DC | PRN
  Administered 2013-11-25: 4 mg via EPIDURAL

## 2013-11-25 MED ORDER — SIMETHICONE 80 MG PO CHEW
80.0000 mg | CHEWABLE_TABLET | ORAL | Status: DC
  Administered 2013-11-26 – 2013-11-27 (×3): 80 mg via ORAL
  Filled 2013-11-25 (×3): qty 1

## 2013-11-25 MED ORDER — DIPHENHYDRAMINE HCL 25 MG PO CAPS
25.0000 mg | ORAL_CAPSULE | Freq: Four times a day (QID) | ORAL | Status: DC | PRN
Start: 2013-11-25 — End: 2013-11-28

## 2013-11-25 MED ORDER — KETOROLAC TROMETHAMINE 60 MG/2ML IM SOLN
60.0000 mg | Freq: Once | INTRAMUSCULAR | Status: AC | PRN
  Administered 2013-11-25: 60 mg via INTRAMUSCULAR

## 2013-11-25 MED ORDER — ZOLPIDEM TARTRATE 5 MG PO TABS: 5.0000 mg | ORAL_TABLET | Freq: Every evening | ORAL | Status: DC | PRN

## 2013-11-25 MED ORDER — OXYTOCIN 40 UNITS IN LACTATED RINGERS INFUSION - SIMPLE MED: 62.5000 mL/h | INTRAVENOUS | Status: AC

## 2013-11-25 MED ORDER — MENTHOL 3 MG MT LOZG
1.0000 | LOZENGE | OROMUCOSAL | Status: DC | PRN
Start: 2013-11-25 — End: 2013-11-28

## 2013-11-25 MED ORDER — SODIUM CHLORIDE 0.9 % IJ SOLN: 3.0000 mL | INTRAMUSCULAR | Status: DC | PRN

## 2013-11-25 MED ORDER — PHENYLEPHRINE 40 MCG/ML (10ML) SYRINGE FOR IV PUSH (FOR BLOOD PRESSURE SUPPORT)
PREFILLED_SYRINGE | INTRAVENOUS | Status: AC
  Filled 2013-11-25: qty 5

## 2013-11-25 MED ORDER — ONDANSETRON HCL 4 MG/2ML IJ SOLN
INTRAMUSCULAR | Status: AC
  Filled 2013-11-25: qty 2

## 2013-11-25 MED ORDER — DIPHENHYDRAMINE HCL 50 MG/ML IJ SOLN: 12.5000 mg | INTRAMUSCULAR | Status: DC | PRN

## 2013-11-25 MED ORDER — CEFAZOLIN SODIUM-DEXTROSE 2-3 GM-% IV SOLR
INTRAVENOUS | Status: DC | PRN
  Administered 2013-11-25: 2 g via INTRAVENOUS

## 2013-11-25 MED ORDER — ONDANSETRON HCL 4 MG PO TABS: 4.0000 mg | ORAL_TABLET | ORAL | Status: DC | PRN

## 2013-11-25 MED ORDER — FENTANYL 2.5 MCG/ML BUPIVACAINE 1/10 % EPIDURAL INFUSION (WH - ANES)
14.0000 mL/h | INTRAMUSCULAR | Status: DC | PRN
  Administered 2013-11-25: 14 mL/h via EPIDURAL

## 2013-11-25 MED ORDER — KETOROLAC TROMETHAMINE 30 MG/ML IJ SOLN: 30.0000 mg | Freq: Four times a day (QID) | INTRAMUSCULAR | Status: DC | PRN

## 2013-11-25 MED ORDER — SODIUM BICARBONATE 8.4 % IV SOLN
INTRAVENOUS | Status: DC | PRN
  Administered 2013-11-25 (×2): 5 mL via EPIDURAL

## 2013-11-25 MED ORDER — MORPHINE SULFATE (PF) 0.5 MG/ML IJ SOLN
INTRAMUSCULAR | Status: DC | PRN
  Administered 2013-11-25: 1 mg via EPIDURAL

## 2013-11-25 MED ORDER — MEPERIDINE HCL 25 MG/ML IJ SOLN
INTRAMUSCULAR | Status: DC | PRN
  Administered 2013-11-25 (×2): 12.5 mg via INTRAVENOUS

## 2013-11-25 MED ORDER — ONDANSETRON HCL 4 MG/2ML IJ SOLN: 4.0000 mg | Freq: Three times a day (TID) | INTRAMUSCULAR | Status: DC | PRN

## 2013-11-25 MED ORDER — LIDOCAINE HCL (PF) 1 % IJ SOLN
INTRAMUSCULAR | Status: DC | PRN
  Administered 2013-11-25: 10 mL

## 2013-11-25 MED ORDER — SCOPOLAMINE 1 MG/3DAYS TD PT72
1.0000 | MEDICATED_PATCH | Freq: Once | TRANSDERMAL | Status: DC
  Administered 2013-11-25: 1.5 mg via TRANSDERMAL

## 2013-11-25 MED ORDER — ONDANSETRON HCL 4 MG/2ML IJ SOLN
INTRAMUSCULAR | Status: DC | PRN
  Administered 2013-11-25: 4 mg via INTRAVENOUS

## 2013-11-25 MED ORDER — LIDOCAINE HCL (PF) 1 % IJ SOLN: 30.0000 mL | INTRAMUSCULAR | Status: DC | PRN

## 2013-11-25 MED ORDER — LIDOCAINE-EPINEPHRINE (PF) 2 %-1:200000 IJ SOLN
INTRAMUSCULAR | Status: AC
  Filled 2013-11-25: qty 20

## 2013-11-25 MED ORDER — MORPHINE SULFATE 0.5 MG/ML IJ SOLN
INTRAMUSCULAR | Status: AC
  Filled 2013-11-25: qty 10

## 2013-11-25 MED ORDER — DIBUCAINE 1 % RE OINT: 1.0000 "application " | TOPICAL_OINTMENT | RECTAL | Status: DC | PRN

## 2013-11-25 MED ORDER — EPHEDRINE 5 MG/ML INJ
INTRAVENOUS | Status: AC
  Filled 2013-11-25: qty 4

## 2013-11-25 MED ORDER — LACTATED RINGERS IV SOLN
40.0000 [IU] | INTRAVENOUS | Status: DC | PRN
  Administered 2013-11-25: 40 [IU] via INTRAVENOUS

## 2013-11-25 MED ORDER — MEPERIDINE HCL 25 MG/ML IJ SOLN: 6.2500 mg | INTRAMUSCULAR | Status: DC | PRN

## 2013-11-25 MED ORDER — KETOROLAC TROMETHAMINE 60 MG/2ML IM SOLN
INTRAMUSCULAR | Status: AC
  Administered 2013-11-25: 60 mg via INTRAMUSCULAR
  Filled 2013-11-25: qty 2

## 2013-11-25 MED ORDER — METOCLOPRAMIDE HCL 5 MG/ML IJ SOLN: 10.0000 mg | Freq: Three times a day (TID) | INTRAMUSCULAR | Status: DC | PRN

## 2013-11-25 MED ORDER — OXYTOCIN 10 UNIT/ML IJ SOLN
INTRAMUSCULAR | Status: AC
  Filled 2013-11-25: qty 4

## 2013-11-25 MED ORDER — PHENYLEPHRINE 40 MCG/ML (10ML) SYRINGE FOR IV PUSH (FOR BLOOD PRESSURE SUPPORT): 80.0000 ug | PREFILLED_SYRINGE | INTRAVENOUS | Status: DC | PRN

## 2013-11-25 MED ORDER — MEPERIDINE HCL 25 MG/ML IJ SOLN
INTRAMUSCULAR | Status: AC
  Filled 2013-11-25: qty 1

## 2013-11-25 MED ORDER — ACETAMINOPHEN 325 MG PO TABS: 650.0000 mg | ORAL_TABLET | ORAL | Status: DC | PRN

## 2013-11-25 MED ORDER — LACTATED RINGERS IV SOLN
500.0000 mL | Freq: Once | INTRAVENOUS | Status: AC
  Administered 2013-11-25: 500 mL via INTRAVENOUS

## 2013-11-25 MED ORDER — SCOPOLAMINE 1 MG/3DAYS TD PT72
MEDICATED_PATCH | TRANSDERMAL | Status: AC
  Administered 2013-11-25: 1.5 mg via TRANSDERMAL
  Filled 2013-11-25: qty 1

## 2013-11-25 MED ORDER — IBUPROFEN 600 MG PO TABS: 600.0000 mg | ORAL_TABLET | Freq: Four times a day (QID) | ORAL | Status: DC | PRN

## 2013-11-25 MED ORDER — SENNOSIDES-DOCUSATE SODIUM 8.6-50 MG PO TABS
2.0000 | ORAL_TABLET | ORAL | Status: DC
  Administered 2013-11-26 – 2013-11-27 (×3): 2 via ORAL
  Filled 2013-11-25 (×3): qty 2

## 2013-11-25 MED ORDER — OXYTOCIN BOLUS FROM INFUSION: 500.0000 mL | INTRAVENOUS | Status: DC

## 2013-11-25 MED ORDER — LEVOTHYROXINE SODIUM 150 MCG PO TABS
150.0000 ug | ORAL_TABLET | Freq: Every day | ORAL | Status: DC
  Administered 2013-11-26 – 2013-11-28 (×3): 150 ug via ORAL
  Filled 2013-11-25 (×3): qty 1

## 2013-11-25 MED ORDER — LACTATED RINGERS IV SOLN: 500.0000 mL | INTRAVENOUS | Status: DC | PRN

## 2013-11-25 MED ORDER — CEFAZOLIN SODIUM-DEXTROSE 2-3 GM-% IV SOLR
INTRAVENOUS | Status: AC
  Filled 2013-11-25: qty 50

## 2013-11-25 MED ORDER — CITRIC ACID-SODIUM CITRATE 334-500 MG/5ML PO SOLN
30.0000 mL | ORAL | Status: DC | PRN
  Administered 2013-11-25: 30 mL via ORAL
  Filled 2013-11-25: qty 15

## 2013-11-25 MED ORDER — ONDANSETRON HCL 4 MG/2ML IJ SOLN
4.0000 mg | INTRAMUSCULAR | Status: DC | PRN
Start: 2013-11-25 — End: 2013-11-28

## 2013-11-25 MED ORDER — NALOXONE HCL 0.4 MG/ML IJ SOLN: 0.4000 mg | INTRAMUSCULAR | Status: DC | PRN

## 2013-11-25 MED ORDER — LACTATED RINGERS IV SOLN: INTRAVENOUS | Status: DC

## 2013-11-25 MED ORDER — SODIUM BICARBONATE 8.4 % IV SOLN
INTRAVENOUS | Status: AC
  Filled 2013-11-25: qty 50

## 2013-11-25 MED ORDER — FENTANYL 2.5 MCG/ML BUPIVACAINE 1/10 % EPIDURAL INFUSION (WH - ANES)
14.0000 mL/h | INTRAMUSCULAR | Status: DC | PRN
  Administered 2013-11-25: 14 mL/h via EPIDURAL
  Filled 2013-11-25: qty 125

## 2013-11-25 MED ORDER — SODIUM CHLORIDE 0.9 % IJ SOLN
3.0000 mL | Freq: Two times a day (BID) | INTRAMUSCULAR | Status: DC
Start: 2013-11-26 — End: 2013-11-28

## 2013-11-25 MED ORDER — TETANUS-DIPHTH-ACELL PERTUSSIS 5-2.5-18.5 LF-MCG/0.5 IM SUSP: 0.5000 mL | Freq: Once | INTRAMUSCULAR | Status: DC

## 2013-11-25 MED ORDER — SIMETHICONE 80 MG PO CHEW
80.0000 mg | CHEWABLE_TABLET | Freq: Three times a day (TID) | ORAL | Status: DC
  Administered 2013-11-26 – 2013-11-27 (×4): 80 mg via ORAL
  Filled 2013-11-25 (×5): qty 1

## 2013-11-25 MED ORDER — FENTANYL 2.5 MCG/ML BUPIVACAINE 1/10 % EPIDURAL INFUSION (WH - ANES)
INTRAMUSCULAR | Status: AC
  Filled 2013-11-25: qty 125

## 2013-11-25 MED ORDER — DIPHENHYDRAMINE HCL 25 MG PO CAPS: 25.0000 mg | ORAL_CAPSULE | ORAL | Status: DC | PRN

## 2013-11-25 MED ORDER — PHENYLEPHRINE 40 MCG/ML (10ML) SYRINGE FOR IV PUSH (FOR BLOOD PRESSURE SUPPORT)
80.0000 ug | PREFILLED_SYRINGE | INTRAVENOUS | Status: AC | PRN
  Administered 2013-11-25 (×3): 80 ug via INTRAVENOUS

## 2013-11-25 MED ORDER — LACTATED RINGERS IV SOLN
INTRAVENOUS | Status: DC
  Administered 2013-11-26: 02:00:00 via INTRAVENOUS

## 2013-11-25 MED ORDER — IBUPROFEN 600 MG PO TABS
600.0000 mg | ORAL_TABLET | Freq: Four times a day (QID) | ORAL | Status: DC
  Administered 2013-11-26 – 2013-11-28 (×9): 600 mg via ORAL
  Filled 2013-11-25 (×9): qty 1

## 2013-11-25 MED ORDER — PRENATAL MULTIVITAMIN CH
1.0000 | ORAL_TABLET | Freq: Every day | ORAL | Status: DC
  Administered 2013-11-26 – 2013-11-27 (×2): 1 via ORAL
  Filled 2013-11-25 (×3): qty 1

## 2013-11-25 MED ORDER — AMPICILLIN SODIUM 2 G IJ SOLR
2.0000 g | Freq: Four times a day (QID) | INTRAMUSCULAR | Status: DC
  Administered 2013-11-25 (×3): 2 g via INTRAVENOUS
  Filled 2013-11-25 (×4): qty 2000

## 2013-11-25 MED ORDER — EPHEDRINE 5 MG/ML INJ: 10.0000 mg | INTRAVENOUS | Status: DC | PRN

## 2013-11-25 MED ORDER — PHENYLEPHRINE 40 MCG/ML (10ML) SYRINGE FOR IV PUSH (FOR BLOOD PRESSURE SUPPORT)
PREFILLED_SYRINGE | INTRAVENOUS | Status: AC
  Filled 2013-11-25: qty 10

## 2013-11-25 MED ORDER — OXYCODONE-ACETAMINOPHEN 5-325 MG PO TABS: 1.0000 | ORAL_TABLET | ORAL | Status: DC | PRN

## 2013-11-25 MED ORDER — SIMETHICONE 80 MG PO CHEW: 80.0000 mg | CHEWABLE_TABLET | ORAL | Status: DC | PRN

## 2013-11-25 MED ORDER — OXYTOCIN 40 UNITS IN LACTATED RINGERS INFUSION - SIMPLE MED
1.0000 m[IU]/min | INTRAVENOUS | Status: DC
  Administered 2013-11-25: 2 m[IU]/min via INTRAVENOUS
  Filled 2013-11-25: qty 1000

## 2013-11-25 MED ORDER — FLEET ENEMA 7-19 GM/118ML RE ENEM
1.0000 | ENEMA | Freq: Every day | RECTAL | Status: DC | PRN
Start: 2013-11-25 — End: 2013-11-28

## 2013-11-25 MED ORDER — FENTANYL CITRATE 0.05 MG/ML IJ SOLN: 25.0000 ug | INTRAMUSCULAR | Status: DC | PRN

## 2013-11-25 SURGICAL SUPPLY — 31 items
CLAMP CORD UMBIL (MISCELLANEOUS) IMPLANT
CLOTH BEACON ORANGE TIMEOUT ST (SAFETY) ×3 IMPLANT
DERMABOND ADHESIVE PROPEN (GAUZE/BANDAGES/DRESSINGS) ×2
DERMABOND ADVANCED (GAUZE/BANDAGES/DRESSINGS) ×2
DERMABOND ADVANCED .7 DNX12 (GAUZE/BANDAGES/DRESSINGS) ×1 IMPLANT
DERMABOND ADVANCED .7 DNX6 (GAUZE/BANDAGES/DRESSINGS) ×1 IMPLANT
DRAPE LG THREE QUARTER DISP (DRAPES) IMPLANT
DRSG OPSITE POSTOP 4X10 (GAUZE/BANDAGES/DRESSINGS) ×3 IMPLANT
DURAPREP 26ML APPLICATOR (WOUND CARE) ×3 IMPLANT
ELECT REM PT RETURN 9FT ADLT (ELECTROSURGICAL) ×3
ELECTRODE REM PT RTRN 9FT ADLT (ELECTROSURGICAL) ×1 IMPLANT
EXTRACTOR VACUUM M CUP 4 TUBE (SUCTIONS) IMPLANT
EXTRACTOR VACUUM M CUP 4' TUBE (SUCTIONS)
GLOVE BIO SURGEON STRL SZ7 (GLOVE) ×3 IMPLANT
GOWN STRL REUS W/TWL LRG LVL3 (GOWN DISPOSABLE) ×6 IMPLANT
KIT ABG SYR 3ML LUER SLIP (SYRINGE) ×3 IMPLANT
NEEDLE HYPO 25X5/8 SAFETYGLIDE (NEEDLE) ×3 IMPLANT
NS IRRIG 1000ML POUR BTL (IV SOLUTION) ×3 IMPLANT
PACK C SECTION WH (CUSTOM PROCEDURE TRAY) ×3 IMPLANT
PAD OB MATERNITY 4.3X12.25 (PERSONAL CARE ITEMS) ×3 IMPLANT
STAPLER VISISTAT 35W (STAPLE) IMPLANT
SUT CHROMIC 0 CTX 36 (SUTURE) ×6 IMPLANT
SUT MON AB 4-0 PS1 27 (SUTURE) ×3 IMPLANT
SUT PDS AB 0 CT 36 (SUTURE) ×3 IMPLANT
SUT PLAIN 0 NONE (SUTURE) IMPLANT
SUT PLAIN 2 0 XLH (SUTURE) IMPLANT
SUT VIC AB 3-0 CT1 27 (SUTURE) ×2
SUT VIC AB 3-0 CT1 TAPERPNT 27 (SUTURE) ×1 IMPLANT
TOWEL OR 17X24 6PK STRL BLUE (TOWEL DISPOSABLE) ×3 IMPLANT
TRAY FOLEY CATH 14FR (SET/KITS/TRAYS/PACK) ×3 IMPLANT
WATER STERILE IRR 1000ML POUR (IV SOLUTION) ×3 IMPLANT

## 2013-11-25 NOTE — Brief Op Note (Signed)
11/25/2013  9:11 PM  PATIENT:  Amy Wood  33 y.o. female  PRE-OPERATIVE DIAGNOSIS:  fetal intolorence of labor, failure to progress  POST-OPERATIVE DIAGNOSIS:  fetal intolorence of labor, failure to progress  PROCEDURE:  Procedure(s): CESAREAN SECTION (N/A)  SURGEON:  Surgeon(s) and Role:    * Juluis MireJohn S Julie Nay, MD - Primary  PHYSICIAN ASSISTANT:   ASSISTANTS: none   ANESTHESIA:   epidural  EBL:  Total I/O In: 3650 [I.V.:1700; Other:1950] Out: 100 [Urine:100]  BLOOD ADMINISTERED:none  DRAINS: Urinary Catheter (Foley)   LOCAL MEDICATIONS USED:  NONE  SPECIMEN:  No Specimen  DISPOSITION OF SPECIMEN:  N/A  COUNTS:  YES  TOURNIQUET:  * No tourniquets in log *  DICTATION: .Other Dictation: Dictation Number [id number]  PLAN OF CARE: Admit to inpatient   PATIENT DISPOSITION:  PACU - hemodynamically stable.   Delay start of Pharmacological VTE agent (>24hrs) due to surgical blood loss or risk of bleeding:no

## 2013-11-25 NOTE — Progress Notes (Signed)
Reviewed pain medication options with pt and family.  Informed pt that she needs to let nurse know when she needs pain medication intervention

## 2013-11-25 NOTE — MAU Note (Signed)
IN B-ROOM   TO CHANGE CLOTHES

## 2013-11-25 NOTE — H&P (Signed)
Amy Wood is a 33 y.o. female presenting at 8339.4 with SROM.  PNC complicated by borderline low amniotic fluid did improve as pregnancy progressed.  Patient has battledore placenta.  Positive GBS. Maternal Medical History:  Reason for admission: Rupture of membranes.   Contractions: Onset was 3-5 hours ago.   Frequency: irregular.   Perceived severity is mild.    Fetal activity: Perceived fetal activity is normal.    Prenatal complications: Oligohydramnios and placental abnormality.   Prenatal Complications - Diabetes: none.    OB History   Grav Para Term Preterm Abortions TAB SAB Ect Mult Living   1 0 0 0 0 0 0 0 0 0      Past Medical History  Diagnosis Date  . Hypothyroidism    Past Surgical History  Procedure Laterality Date  . No past surgeries     Family History: family history is not on file. Social History:  reports that she has never smoked. She has never used smokeless tobacco. She reports that she does not drink alcohol or use illicit drugs.   Prenatal Transfer Tool  Maternal Diabetes: No Genetic Screening: Declined Maternal Ultrasounds/Referrals: Abnormal:  Findings:   Other:low amniotic fluid Fetal Ultrasounds or other Referrals:  None Maternal Substance Abuse:  No Significant Maternal Medications:  None Significant Maternal Lab Results:  Lab values include: Group B Strep positive Other Comments:  None  ROS  Dilation: 2 Effacement (%): 50 Exam by:: Dr Arelia SneddonMcComb Blood pressure 121/76, pulse 92, temperature 98.7 F (37.1 C), temperature source Oral, resp. rate 20, height 5\' 4"  (1.626 m), weight 81.647 kg (180 lb), last menstrual period 02/21/2013. Maternal Exam:  Uterine Assessment: Contraction strength is mild.  Contraction frequency is irregular.   Abdomen: Fundal height is c/w dates.   Estimated fetal weight is 7.   Fetal presentation: vertex  Introitus: Amniotic fluid character: clear.  Cervix: 2 cm and 50 % effaced vertex at  -1  Fetal Exam Fetal State Assessment: Category I - tracings are normal.     Physical Exam  Prenatal labs: ABO, Rh: --/--/O POS (06/17 0530) Antibody: NEG (06/17 0530) Rubella: Immune (11/11 0000) RPR: Nonreactive (11/11 0000)  HBsAg: Negative (11/11 0000)  HIV: Non-reactive (11/11 0000)  GBS: Positive (05/27 0000)   Assessment/Plan: IUP at 39.4 with SROM History of low amniotic fluid Positive GBS Ampicillin  Pitocin for lack of progression.  Risk discussed   MCCOMB,JOHN S 11/25/2013, 7:48 AM

## 2013-11-25 NOTE — Anesthesia Postprocedure Evaluation (Signed)
  Anesthesia Post-op Note  Patient: Amy Wood  Procedure(s) Performed: Procedure(s) (LRB): CESAREAN SECTION (N/A)  Patient Location: PACU  Anesthesia Type: Spinal  Level of Consciousness: awake and alert   Airway and Oxygen Therapy: Patient Spontanous Breathing  Post-op Pain: mild  Post-op Assessment: Post-op Vital signs reviewed, Patient's Cardiovascular Status Stable, Respiratory Function Stable, Patent Airway and No signs of Nausea or vomiting  Last Vitals:  Filed Vitals:   11/25/13 2145  BP: 95/54  Pulse: 82  Temp:   Resp: 28    Post-op Vital Signs: stable   Complications: No apparent anesthesia complications

## 2013-11-25 NOTE — Progress Notes (Signed)
Patient ID: Amy Wood, female   DOB: 06/01/1981, 33 y.o.   MRN: [id number] Was on 8 mu of pitocin fhr with several variables Pitocin off Now fhr cat one  May place iupc

## 2013-11-25 NOTE — Progress Notes (Signed)
Patient ID: Dalbert BatmanShilpashree Dwyer, female   DOB: 12-19-1980, 33 y.o.   MRN: [id number] episoides of bradycardia Now with repetitive late decel Pitocin d/c ed   No change in cervix Precede with cesarean section Risk of cesarean section discussed.  These include:  Risk of infection;  Risk of hemorrhage that could require transfusions with the associated risk of aids or hepatitis;  Excessive bleeding could require hysterectomy;  Risk of injury to adjacent organs including bladder, bowel or ureters;  Risk of DVT's and possible pulmonary embolus.  Patient expresses a understanding of indications and risks.;

## 2013-11-25 NOTE — Anesthesia Procedure Notes (Signed)

## 2013-11-25 NOTE — Anesthesia Preprocedure Evaluation (Signed)

## 2013-11-25 NOTE — Transfer of Care (Signed)
Immediate Anesthesia Transfer of Care Note  Patient: Amy Wood  Procedure(s) Performed: Procedure(s): CESAREAN SECTION (N/A)  Patient Location: PACU  Anesthesia Type:Epidural  Level of Consciousness: awake  Airway & Oxygen Therapy: Patient Spontanous Breathing  Post-op Assessment: Report given to PACU RN  Post vital signs: Reviewed and stable  Complications: No apparent anesthesia complications

## 2013-11-25 NOTE — MAU Note (Signed)
PT  SAYS SHE WAS IN BED - AND FELT  GUSH-  ON ARRIVAL- PANTS  WET- CHANGED CLOTHES.     ON  SAT - IN MAU-   VE CLOSED,    DENIES HSV AND MRSA. GBS-  POSITIVE

## 2013-11-25 NOTE — Op Note (Signed)
Patient Elonda HuskynameSrinivasamurthy, Henslee DICTATION# [id number] CSN# [id number]  Juluis MireMCCOMB,Margaretann Abate S, MD 11/25/2013 9:13 PM

## 2013-11-26 ENCOUNTER — Encounter (HOSPITAL_COMMUNITY): Payer: Self-pay | Admitting: Obstetrics and Gynecology

## 2013-11-26 LAB — CBC
HEMATOCRIT: 34.2 % — AB (ref 36.0–46.0)
HEMOGLOBIN: 11.6 g/dL — AB (ref 12.0–15.0)
MCH: 31.2 pg (ref 26.0–34.0)
MCHC: 33.9 g/dL (ref 30.0–36.0)
MCV: 91.9 fL (ref 78.0–100.0)
Platelets: 203 10*3/uL (ref 150–400)
RBC: 3.72 MIL/uL — ABNORMAL LOW (ref 3.87–5.11)
RDW: 12.8 % (ref 11.5–15.5)
WBC: 16.5 10*3/uL — AB (ref 4.0–10.5)

## 2013-11-26 NOTE — Lactation Note (Signed)
This note was copied from the chart of Amy Wood. Lactation Consultation Note  Patient Name: Amy Dalbert BatmanShilpashree Sanks WUJWJ'XToday's Date: 11/26/2013 Reason for consult: Initial assessment Baby awake and giving feeding ques, Mom called for assist with latch. Mom trying to position in football hold but baby cannot sustain good depth. LC assisted with positioning and demonstrating to Mom and FOB how to use breast compression to obtain good depth with latch. Baby will slip off to the end of the nipple, so took few times to get baby to sustain the depth. Some dimpling and smacking noises present when baby would slip to shallow latch and made parents aware to re-latch if this occurs. Baby sucks his mouth/tonge and will demonstrate some humping of the tongue with suck exam. He will however open his mouth wide when trying to latch.  Once baby was able to sustain good depth, he demonstrated a good rhythmic suck with some swallows notes. BF basics reviewed with parents. Cluster feeding discussed. Lactation brochure left for review, advised of OP services and support group. Encouraged to call for assist with latch till parents feel more independent and baby is sustaining good depth.   Maternal Data Formula Feeding for Exclusion: No Has patient been taught Hand Expression?: Yes Does the patient have breastfeeding experience prior to this delivery?: No  Feeding Feeding Type: Breast Fed Length of feed: 25 min  LATCH Score/Interventions Latch: Grasps breast easily, tongue down, lips flanged, rhythmical sucking. ([facility] with breast compression to latch) Intervention(s): Adjust position;Assist with latch;Breast massage;Breast compression  Audible Swallowing: A few with stimulation  Type of Nipple: Everted at rest and after stimulation  Comfort (Breast/Nipple): Soft / non-tender     Hold (Positioning): Assistance needed to correctly position infant at breast and maintain  latch. Intervention(s): Breastfeeding basics reviewed;Support Pillows;Position options;Skin to skin  LATCH Score: 8  Lactation Tools Discussed/Used     Consult Status Consult Status: Follow-up Date: 11/27/13 Follow-up type: In-patient    Amy Wood, Amy Wood 11/26/2013, 1:55 PM

## 2013-11-26 NOTE — Addendum Note (Signed)
Addendum created 11/26/13 0809 by Suella Groveoderick C Moore, CRNA   Modules edited: Notes Section   Notes Section:  File: [id number]

## 2013-11-26 NOTE — Anesthesia Postprocedure Evaluation (Signed)
  Anesthesia Post-op Note  Patient: Amy Wood  Procedure(s) Performed: Procedure(s): CESAREAN SECTION (N/A)  Patient Location: Mother/Baby  Anesthesia Type:Epidural  Level of Consciousness: awake and lethargic  Airway and Oxygen Therapy: Patient Spontanous Breathing  Post-op Pain: none  Post-op Assessment: Post-op Vital signs reviewed, Patient's Cardiovascular Status Stable, Respiratory Function Stable, Patent Airway, No signs of Nausea or vomiting, Adequate PO intake, Pain level controlled, No headache, No backache, No residual numbness and No residual motor weakness  Post-op Vital Signs: Reviewed and stable  Last Vitals:  Filed Vitals:   11/26/13 0539  BP: 118/72  Pulse: 89  Temp:   Resp:     Complications: No apparent anesthesia complications

## 2013-11-26 NOTE — Progress Notes (Signed)
Subjective: Postpartum Day 1: Cesarean Delivery Patient reports incisional pain and tolerating PO.  Declines circ.  Objective: Vital signs in last 24 hours: Temp:  [97.5 F (36.4 C)-99.4 F (37.4 C)] 98.5 F (36.9 C) (06/18 0525) Pulse Rate:  [73-100] 89 (06/18 0539) Resp:  [16-34] 20 (06/18 0525) BP: (90-140)/(41-95) 118/72 mmHg (06/18 0539) SpO2:  [94 %-100 %] 96 % (06/18 0525)  Physical Exam:  General: alert, cooperative and appears stated age Lochia: appropriate Uterine Fundus: firm Incision: healing well, no significant drainage.  Honeycomb dressing on. DVT Evaluation: No evidence of DVT seen on physical exam. Negative Homan's sign. No cords or calf tenderness.   Recent Labs  11/25/13 0530 11/26/13 0600  HGB 14.2 11.6*  HCT 40.9 34.2*    Assessment/Plan: Status post Cesarean section. Doing well postoperatively.  Continue current care.  MORRIS, MEGAN 11/26/2013, 8:46 AM

## 2013-11-26 NOTE — Op Note (Signed)
NAMKari Baars:  Goll, Lindie ACCOUNT NO.:  [id number]  MEDICAL RECORD NO.:  [id number]  LOCATION:  9143                          FACILITY:  [facility]  PHYSICIAN:  Juluis MireJohn S. Raylee Adamec, M.D.   DATE OF BIRTH:  1981/05/01  DATE OF PROCEDURE:  11/25/2013 DATE OF DISCHARGE:                              OPERATIVE REPORT   PREOPERATIVE DIAGNOSES:  Intrauterine pregnancy at term with failure to progress and fetal intolerance to labor.  POSTOPERATIVE DIAGNOSES:  Intrauterine pregnancy at term with failure to progress and fetal intolerance to labor.  OPERATIVE PROCEDURE:  Low transverse cesarean section.  SURGEON:  Juluis MireJohn S. Tayjon Halladay, M.D.  ANESTHESIA:  Epidural.  ESTIMATED BLOOD LOSS:  400 mL.  PACKS AND DRAINS:  Urethral Foley.  INTRAOPERATIVE BLOOD PLACED:  None.  COMPLICATIONS:  None.  INDICATION:  The patient is a 33 year old primigravida female presented with spontaneous rupture of membranes, progressed to 5 cm dilatation, had arrest of dilatation despite adequate monitor __________ by IUPC. Infant began to have repetitive decels, some of which had a late component.  This continued despite position changes.  Pitocin was discontinued.  Cervix remained unchanged.  Decision was to proceed with primary cesarean section.  The risks were discussed including the risk of infection, risk of hemorrhage.  Hemorrhage could lead to transfusion with the risk of hepatitis.  Excessive bleeding could require hysterectomy.  Risk of injury to adjacent organs including bladder, bowel, ureters that could require further exploratory surgery.  Risk of deep venous thrombosis and pulmonary embolus.  The patient expressed understanding of potential risks and complications.  DESCRIPTION OF PROCEDURE:  The patient was taken to OR, placed in supine position with left lateral tilt.  After satisfactory level of epidural anesthesia was obtained, the abdomen was prepped and draped in sterile field.  A  low-transverse skin incision made with knife, carried through subcutaneous tissue.  Fascia was entered sharply.  Incision was fashioned laterally.  Fascia taken off the muscle superiorly and inferiorly.  Rectus muscles separated in midline.  Perineum was entered sharply.  Incision of perineum extended both superiorly and inferiorly. A low-transverse bladder flap was developed.  Low transverse uterine scission begun with the knife and extended laterally using manual traction.  Amniotic fluid was clear.  The infant was delivered with elevation of head and fundal pressure.  There was a nuchal cord.  The infant was a viable female.  Apgars were 8 and 9.  Placenta was then delivered manually.  Uterus was exteriorized for closure.  Uterus was closed in a running locking suture of 0 chromic using a 2-layer closure technique.  We had good hemostasis, clear urine output.  Uterus returned to the abdominal cavity.  Muscles of peritoneum closed with running suture of 3-0 Vicryl.  Fascia was closed with running suture of 0 PDS. Skin was closed in running subcuticular of 4-0 Monocryl and Dermabond. Sponge, instrument, and needle count was correct by circulating nurse x2.  Foley catheter remained clear at the time of closure.  The patient tolerated the procedure well and was returned to recovery room in good condition.     Juluis MireJohn S. Justyne Roell, M.D.     JSM/MEDQ  D:  11/25/2013  T:  11/25/2013  Job:  [id number]

## 2013-11-27 NOTE — Progress Notes (Signed)
Subjective: Postpartum Day 2: Cesarean Delivery Patient reports tolerating PO and no problems voiding.  Reports baby with hyperbilirubinemia, questionable need for phototherapy  Objective: Vital signs in last 24 hours: Temp:  [97.9 F (36.6 C)-98.7 F (37.1 C)] 98 F (36.7 C) (06/19 0631) Pulse Rate:  [73-96] 78 (06/19 0631) Resp:  [18-20] 19 (06/19 0631) BP: (90-122)/(53-73) 103/53 mmHg (06/19 0631) SpO2:  [96 %-100 %] 100 % (06/19 0631)  Physical Exam:  General: alert and cooperative Lochia: appropriate Uterine Fundus: firm Incision: honeycomb dressing with scant old drainage noted on R margin of bandage DVT Evaluation: No evidence of DVT seen on physical exam. Negative Homan's sign. No cords or calf tenderness. No significant calf/ankle edema.   Recent Labs  11/25/13 0530 11/26/13 0600  HGB 14.2 11.6*  HCT 40.9 34.2*    Assessment/Plan: Status post Cesarean section. Doing well postoperatively.  Continue current care.  CURTIS,CAROL G 11/27/2013, 8:43 AM

## 2013-11-27 NOTE — Lactation Note (Addendum)
This note was copied from the chart of Amy Wood. Lactation Consultation Note  Patient Name: Amy Wood: 11/27/2013 Reason for consult: Follow-up assessment Baby 43 hours of life. Mom requested LC to assess latch, but [facility] Mom reports baby deeply latched without NS and she could hear swallows as baby suckled. Mom's nipple is normally shaped, baby is contently lying on mom STS. LC placed gloved finger into baby's mouth to assess sucking as baby had been tongue sucking earlier. Baby sucked gloved finger well, lips flanged, and relaxed. Enc mom to ask nurse to see next latch, and to continue to nurse baby with cues and to offer breast at least 8-12 times/24 hours.   Maternal Data    Feeding Feeding Type:  ([facility] nursing [facility], mom had called out to see a latch.)  LATCH Score/Interventions Latch: Grasps breast easily, tongue down, lips flanged, rhythmical sucking. Intervention(s): Adjust position;Assist with latch;Breast compression  Audible Swallowing: A few with stimulation Intervention(s): Skin to skin Intervention(s): Skin to skin  Type of Nipple: Everted at rest and after stimulation  Comfort (Breast/Nipple): Soft / non-tender     Hold (Positioning): Assistance needed to correctly position infant at breast and maintain latch.  LATCH Score: 8  Lactation Tools Discussed/Used     Consult Status Consult Status: Follow-up Wood: 11/28/13 Follow-up type: In-patient    Geralynn OchsWILLIARD, JENNIFER 11/27/2013, 4:00 PM

## 2013-11-27 NOTE — Lactation Note (Signed)
This note was copied from the chart of Amy Ahlivia Cerrone. Lactation Consultation Note    Follow up consult with this mom and baby, now 38 hours post partum. Mom was having trouble latching baby due to tongue sucking. She was given a nipple shield, and when she applied it, very little nipple was in the shield. i applied shield for her, it was filled with her nipple, but this latch was painful. Since mom has a soft but evert nipple, and very compressible tissue, I showed her how to latch baby without shield. He was tongue sucking, and a couple of minutes of suck training and  gently oral stimulation, he was ready to latch, with th a wide mouth. Mom was pleased at him sucking and how comfortable the latch was.I advised mom to do suck training before latching. After about 5 minutes, his sucking noise was heard, and he would be off the breast. This happened about 3 time while I was in the room. Mom was able to latch independently after a few tries, and I showed dad how to help flange the baby's bottom lip. BBReview of Baby and Me book breast feeding information reviewed with parents. They know to all for questions/concerns  Patient Name: Amy Wood [id number]'s Date: 11/27/2013 Reason for consult: Follow-up assessment   Maternal Data    Feeding Feeding Type: Breast Fed Length of feed: 15 min (on and off, but at least 15 minutes of active cuking with visible swallows)  LATCH Score/Interventions Latch: Repeated attempts needed to sustain latch, nipple held in mouth throughout feeding, stimulation needed to elicit sucking reflex. (baby tongue sucing, mom wanted 24 NS, but this was not agood tool for this mom and baby, latched well without NS) Intervention(s): Adjust position;Assist with latch;Breast compression  Audible Swallowing: A few with stimulation Intervention(s): Skin to skin;Hand expression Intervention(s): Skin to skin  Type of Nipple: Everted at rest and after  stimulation (mom shown how to use BF pillow and FB hold and latch baby independently)  Comfort (Breast/Nipple): Soft / non-tender     Hold (Positioning): Assistance needed to correctly position infant at breast and maintain latch. Intervention(s): Breastfeeding basics reviewed;Support Pillows;Position options;Skin to skin  LATCH Score: 7  Lactation Tools Discussed/Used Tools: Nipple Shields Nipple shield size: 24   Consult Status Consult Status: Follow-up Date: 11/28/13 Follow-up type: In-patient    Amy Wood, Amy Wood 11/27/2013, 11:26 AM

## 2013-11-28 MED ORDER — METHOCARBAMOL 500 MG PO TABS: 500.0000 mg | ORAL_TABLET | Freq: Four times a day (QID) | ORAL | Status: AC | PRN

## 2013-11-28 MED ORDER — PANTOPRAZOLE SODIUM 20 MG PO TBEC
20.0000 mg | DELAYED_RELEASE_TABLET | Freq: Every day | ORAL | Status: DC
  Administered 2013-11-28: 20 mg via ORAL
  Filled 2013-11-28 (×2): qty 1

## 2013-11-28 MED ORDER — IBUPROFEN 600 MG PO TABS: 600.0000 mg | ORAL_TABLET | Freq: Four times a day (QID) | ORAL | Status: AC | PRN

## 2013-11-28 MED ORDER — OXYCODONE-ACETAMINOPHEN 5-325 MG PO TABS: 1.0000 | ORAL_TABLET | Freq: Four times a day (QID) | ORAL | Status: AC | PRN

## 2013-11-28 NOTE — Discharge Summary (Signed)
Obstetric Discharge Summary Reason for Admission: onset of labor Prenatal Procedures: ultrasound Intrapartum Procedures: cesarean: low cervical, transverse Postpartum Procedures: none Complications-Operative and Postpartum: none Hemoglobin  Date Value Ref Range Status  11/26/2013 11.6* 12.0 - 15.0 g/dL Final     DELTA CHECK NOTED     REPEATED TO VERIFY     HCT  Date Value Ref Range Status  11/26/2013 34.2* 36.0 - 46.0 % Final    Physical Exam:  General: alert, cooperative and no distress Lochia: appropriate Uterine Fundus: firm Incision: healing well DVT Evaluation: No evidence of DVT seen on physical exam.  Discharge Diagnoses: Term Pregnancy-delivered  Discharge Information: Date: 11/28/2013 Activity: pelvic rest Diet: routine Medications: PNV, Ibuprofen and Percocet Condition: stable Instructions: refer to practice specific booklet Discharge to: home   Newborn Data: Live born female  Birth Weight: 7 lb 3 oz (3260 g) APGAR: 9, 9  Home with mother.  TOMBLIN II,JAMES E 11/28/2013, 10:06 AM

## 2013-11-28 NOTE — Progress Notes (Signed)
Subjective: Postpartum Day 3: Cesarean Delivery Patient reports tolerating PO, + flatus and no problems voiding.    Objective: Vital signs in last 24 hours: Temp:  [98 F (36.7 C)-98.2 F (36.8 C)] 98 F (36.7 C) (06/20 0554) Pulse Rate:  [73-81] 73 (06/20 0554) Resp:  [18] 18 (06/20 0554) BP: (115-121)/(79-80) 115/79 mmHg (06/20 0554) SpO2:  [100 %] 100 % (06/20 0554)  Physical Exam:  General: alert, cooperative and no distress Lochia: appropriate Uterine Fundus: firm Incision: healing well DVT Evaluation: No evidence of DVT seen on physical exam.   Recent Labs  11/26/13 0600  HGB 11.6*  HCT 34.2*    Assessment/Plan: Status post Cesarean section. Doing well postoperatively.  Discharge home with standard precautions and return to clinic in 4-6 weeks.  TOMBLIN II,JAMES E 11/28/2013, 10:04 AM

## 2014-04-12 ENCOUNTER — Encounter (HOSPITAL_COMMUNITY): Payer: Self-pay | Admitting: Obstetrics and Gynecology
